# Patient Record
Sex: Female | Born: 1980 | Race: Black or African American | Hispanic: No | Smoking: Never smoker
Health system: Southern US, Community
[De-identification: ages and names within clinical notes are randomized; demographics above are authoritative.]

## PROBLEM LIST (undated history)

## (undated) DIAGNOSIS — Z789 Other specified health status: Secondary | ICD-10-CM

## (undated) HISTORY — PX: NO PAST SURGERIES: SHX2092

---

## 2014-03-24 ENCOUNTER — Encounter (HOSPITAL_COMMUNITY): Payer: Self-pay | Admitting: *Deleted

## 2014-03-24 ENCOUNTER — Inpatient Hospital Stay (HOSPITAL_COMMUNITY)
Admission: AD | Admit: 2014-03-24 | Discharge: 2014-03-24 | Disposition: A | Discharge status: Home / Self Care | Payer: Self-pay | Source: Ambulatory Visit | Attending: Obstetrics & Gynecology | Admitting: Obstetrics & Gynecology

## 2014-03-24 ENCOUNTER — Inpatient Hospital Stay (HOSPITAL_COMMUNITY): Payer: Self-pay

## 2014-03-24 DIAGNOSIS — R109 Unspecified abdominal pain: Secondary | ICD-10-CM | POA: Insufficient documentation

## 2014-03-24 DIAGNOSIS — O200 Threatened abortion: Secondary | ICD-10-CM | POA: Insufficient documentation

## 2014-03-24 HISTORY — DX: Other specified health status: Z78.9

## 2014-03-24 LAB — URINALYSIS, ROUTINE W REFLEX MICROSCOPIC
Bilirubin Urine: NEGATIVE
Glucose, UA: NEGATIVE mg/dL
Hgb urine dipstick: NEGATIVE
Ketones, ur: NEGATIVE mg/dL
LEUKOCYTES UA: NEGATIVE
Nitrite: NEGATIVE
PH: 5.5 (ref 5.0–8.0)
Protein, ur: NEGATIVE mg/dL
Specific Gravity, Urine: >1.03 — ABNORMAL HIGH (ref 1.005–1.030)
Urobilinogen, UA: 0.2 mg/dL (ref 0.0–1.0)

## 2014-03-24 LAB — CBC
HCT: 27.7 % — ABNORMAL LOW (ref 36.0–46.0)
Hemoglobin: 9.4 g/dL — ABNORMAL LOW (ref 12.0–15.0)
MCH: 28.2 pg (ref 26.0–34.0)
MCHC: 33.9 g/dL (ref 30.0–36.0)
MCV: 83.2 fL (ref 78.0–100.0)
PLATELETS: 188 10*3/uL (ref 150–400)
RBC: 3.33 MIL/uL — ABNORMAL LOW (ref 3.87–5.11)
RDW: 14.3 % (ref 11.5–15.5)
WBC: 8.7 10*3/uL (ref 4.0–10.5)

## 2014-03-24 LAB — ABO/RH: ABO/RH(D): O POS

## 2014-03-24 LAB — HCG, QUANTITATIVE, PREGNANCY: HCG, BETA CHAIN, QUANT, S: 1181 m[IU]/mL — AB (ref <5)

## 2014-03-24 LAB — POCT PREGNANCY, URINE: PREG TEST UR: POSITIVE — AB

## 2014-03-24 MED ORDER — HYDROMORPHONE HCL PF 1 MG/ML IJ SOLN
1.0000 mg | Freq: Once | INTRAMUSCULAR | Status: AC
  Administered 2014-03-24: 1 mg via INTRAVENOUS
  Filled 2014-03-24: qty 1

## 2014-03-24 MED ORDER — MISOPROSTOL 200 MCG PO TABS: ORAL_TABLET | ORAL | Status: AC

## 2014-03-24 MED ORDER — TRAMADOL HCL 50 MG PO TABS: 50.0000 mg | ORAL_TABLET | Freq: Four times a day (QID) | ORAL | Status: AC | PRN

## 2014-03-24 NOTE — Discharge Instructions (Signed)
Miscarriage °A miscarriage is the loss of an unborn baby (fetus) before the 20th week of pregnancy. The cause is often unknown.  °HOME CARE °· You may need to stay in bed (bed rest), or you may be able to do light activity. Go about activity as told by your doctor. °· Have help at home. °· Write down how many pads you use each day. Write down how soaked they are. °· Do not use tampons. Do not wash out your vagina (douche) or have sex (intercourse) until your doctor approves. °· Only take medicine as told by your doctor. °· Do not take aspirin. °· Keep all doctor visits as told. °· If you or your partner have problems with grieving, talk to your doctor. You can also try counseling. Give yourself time to grieve before trying to get pregnant again. °GET HELP RIGHT AWAY IF: °· You have bad cramps or pain in your back or belly (abdomen). °· You have a fever. °· You pass large clumps of blood (clots) from your vagina that are walnut-sized or larger. Save the clumps for your doctor to see. °· You pass large amounts of tissue from your vagina. Save the tissue for your doctor to see. °· You have more bleeding. °· You have thick, bad-smelling fluid (discharge) coming from the vagina. °· You get lightheaded, weak, or you pass out (faint). °· You have chills. °MAKE SURE YOU: °· Understand these instructions. °· Will watch your condition. °· Will get help right away if you are not doing well or get worse. °Document Released: 12/05/2011 Document Reviewed: 12/05/2011 °Exit[employer]® Patient Information ©2015 [employer]. This information is not intended to replace advice given to you by your health care provider. Make sure you discuss any questions you have with your health care provider. ° °

## 2014-03-24 NOTE — MAU Provider Note (Signed)
First Provider Initiated Contact with Patient 03/24/14 1455      Chief Complaint:  Abdominal Pain and Vaginal Bleeding   Melinda Sandoval is  33 y.o. [id number] at Unknown gestation presents complaining of Abdominal Pain and Vaginal Bleeding She has not had any PNC this pregnancy. She was at the [employer] with her family and started bleeding heavily and EMS was called. Is cramping heavily. She lives in [place], and is here visiting.   Obstetrical/Gynecological History: OB History   Grav Para Term Preterm Abortions TAB SAB Ect Mult Living   5 3   2  2   3      Past Medical History: Past Medical History  Diagnosis Date  . Medical history non-contributory     Past Surgical History: Past Surgical History  Procedure Laterality Date  . No past surgeries      Family History: History reviewed. No pertinent family history.  Social History: History  Substance Use Topics  . Smoking status: Never Smoker   . Smokeless tobacco: Never Used  . Alcohol Use: No    Allergies: No Known Allergies  Meds:  No prescriptions prior to admission    Review of Systems   Constitutional: Negative for fever and chills Eyes: Negative for visual disturbances Respiratory: Negative for shortness of breath, dyspnea Cardiovascular: Negative for chest pain or palpitations  Gastrointestinal: Negative for vomiting, diarrhea and constipation Genitourinary: Negative for dysuria and urgency Musculoskeletal: Negative for back pain, joint pain, myalgias  Neurological: Negative for dizziness and headaches    Physical Exam  Blood pressure 93/54, pulse 102, temperature 98.4 F (36.9 C), temperature source Oral, resp. rate 16, height 5\' 8"  (1.727 m), weight 74.39 kg (164 lb), last menstrual period 12/27/2013, SpO2 100.00%. GENERAL: Well-developed, well-nourished female in no acute distress.  LUNGS: Clear to auscultation bilaterally.  HEART: Regular rate and rhythm. ABDOMEN: Soft, nontender, nondistended,  gravid.  EXTREMITIES: Nontender, no edema, 2+ distal pulses. DTR's 2+ CERVICAL EXAM: Large amount of bright red vaginal bleeding.   Labs: Results for orders placed during the hospital encounter of 03/24/14 (from the past 24 hour(s))  URINALYSIS, ROUTINE W REFLEX MICROSCOPIC   Collection Time    03/24/14  2:27 PM      Result Value Ref Range   Color, Urine YELLOW  YELLOW   APPearance CLEAR  CLEAR   Specific Gravity, Urine >1.030 (*) 1.005 - 1.030   pH 5.5  5.0 - 8.0   Glucose, UA NEGATIVE  NEGATIVE mg/dL   Hgb urine dipstick NEGATIVE  NEGATIVE   Bilirubin Urine NEGATIVE  NEGATIVE   Ketones, ur NEGATIVE  NEGATIVE mg/dL   Protein, ur NEGATIVE  NEGATIVE mg/dL   Urobilinogen, UA 0.2  0.0 - 1.0 mg/dL   Nitrite NEGATIVE  NEGATIVE   Leukocytes, UA NEGATIVE  NEGATIVE  POCT PREGNANCY, URINE   Collection Time    03/24/14  2:35 PM      Result Value Ref Range   Preg Test, Ur POSITIVE (*) NEGATIVE  CBC   Collection Time    03/24/14  2:40 PM      Result Value Ref Range   WBC 8.7  4.0 - 10.5 K/uL   RBC 3.33 (*) 3.87 - 5.11 MIL/uL   Hemoglobin 9.4 (*) 12.0 - 15.0 g/dL   HCT 30.827.7 (*) 65.736.0 - 84.646.0 %   MCV 83.2  78.0 - 100.0 fL   MCH 28.2  26.0 - 34.0 pg   MCHC 33.9  30.0 - 36.0 g/dL   RDW  14.3  11.5 - 15.5 %   Platelets 188  150 - 400 K/uL  HCG, QUANTITATIVE, PREGNANCY   Collection Time    03/24/14  2:51 PM      Result Value Ref Range   hCG, Beta Chain, Quant, S 1181 (*) <5 mIU/mL  ABO/RH   Collection Time    03/24/14  2:51 PM      Result Value Ref Range   ABO/RH(D) O POS     Imaging Studies:  US Ob Comp Less 14 Wks  03/24/2014   CLINICAL DATA:  33 year old G6 P3 AB2, LMP 12/27/2013 (12 weeks 3 days), quantitative beta HCG 1,181, presenting with heavy vaginal bleeding.  EXAM: OBSTETRIC <14 WK [place] AND TRANSVAGINAL OB US  TECHNIQUE: Both transabdominal and transvaginal ultrasound examinations were performed for complete evaluation of the gestation as well as the maternal uterus, adnexal  regions, and pelvic cul-de-sac. Transvaginal technique was performed to assess early pregnancy.  COMPARISON:  None.  FINDINGS: Intrauterine gestational sac: Identified in the lower uterine segment and in the uterine cervix.  Yolk sac:  Not identified.  Embryo:  Not identified.  Cardiac Activity: Not applicable.  MSD:  36.1 mm   9 w   1 d  [place] EDC: Not applicable.  Maternal uterus/adnexae: Normal-appearing ovaries bilaterally, the left ovary measuring approximately 4.1 x 2.0 x 2.5 cm and the right ovary measuring approximately 2.3 x 1.7 x 1.8 cm. No adnexal masses or free pelvic fluid.  IMPRESSION: 1. Intrauterine gestational sac in the lower uterine segment and in the uterine cervix, consistent with a spontaneous abortion in progress or impending spontaneous abortion. 2. Absent embryo despite a mean sac diameter of 36.1 mm. Findings meet definitive criteria for failed pregnancy. This follows SRU consensus guidelines: Diagnostic Criteria for Nonviable Pregnancy Early in the First Trimester. Macy Mis J Med [id number]. 3. Normal-appearing ovaries. No adnexal masses or free pelvic fluid. These results were called by telephone at the time of interpretation on 03/24/2014 at 4:28 PM to Scarlet in the [facility]U, who verbally acknowledged these results.   Electronically Signed   By: Hulan Saas M.D.   On: 03/24/2014 16:29   US Ob Transvaginal  03/24/2014   CLINICAL DATA:  33 year old G6 P3 AB2, LMP 12/27/2013 (12 weeks 3 days), quantitative beta HCG 1,181, presenting with heavy vaginal bleeding.  EXAM: OBSTETRIC <14 WK [place] AND TRANSVAGINAL OB US  TECHNIQUE: Both transabdominal and transvaginal ultrasound examinations were performed for complete evaluation of the gestation as well as the maternal uterus, adnexal regions, and pelvic cul-de-sac. Transvaginal technique was performed to assess early pregnancy.  COMPARISON:  None.  FINDINGS: Intrauterine gestational sac: Identified in the lower uterine segment and in the  uterine cervix.  Yolk sac:  Not identified.  Embryo:  Not identified.  Cardiac Activity: Not applicable.  MSD:  36.1 mm   9 w   1 d  [place] EDC: Not applicable.  Maternal uterus/adnexae: Normal-appearing ovaries bilaterally, the left ovary measuring approximately 4.1 x 2.0 x 2.5 cm and the right ovary measuring approximately 2.3 x 1.7 x 1.8 cm. No adnexal masses or free pelvic fluid.  IMPRESSION: 1. Intrauterine gestational sac in the lower uterine segment and in the uterine cervix, consistent with a spontaneous abortion in progress or impending spontaneous abortion. 2. Absent embryo despite a mean sac diameter of 36.1 mm. Findings meet definitive criteria for failed pregnancy. This follows SRU consensus guidelines: Diagnostic Criteria for Nonviable Pregnancy Early in the First Trimester. N Engl J Med  2013;369:1443-51. 3. Normal-appearing ovaries. No adnexal masses or free pelvic fluid. These results were called by telephone at the time of interpretation on 03/24/2014 at 4:28 PM to Scarlet in the MAU, who verbally acknowledged these results.   Electronically Signed   By: Hulan Saashomas  Lawrence M.D.   On: 03/24/2014 16:29    Assessment: Melinda Sandoval is  33 y.o. [id number] at Unknown presents with inevitable abortion. Discussed with Dr. Debroah LoopArnold.   Plan: Meds ordered this encounter  Medications  . HYDROmorphone (DILAUDID) injection 1 mg    Sig:   . misoprostol (CYTOTEC) 200 MCG tablet    Sig: Take both tablets by mouth    Dispense:  2 tablet    Refill:  0    Order Specific Question:  Supervising Provider    Answer:  Adam PhenixARNOLD, JAMES G [[id number]]  . traMADol (ULTRAM) 50 MG tablet    Sig: Take 1 tablet (50 mg total) by mouth every 6 (six) hours as needed.    Dispense:  20 tablet    Refill:  0    Order Specific Question:  Supervising Provider    Answer:  Adam PhenixARNOLD, JAMES G [[id number]]   Copy of records from this visit sent with pt to take to her OB/GYN in [place].    CRESENZO-DISHMAN,Karanveer Ramakrishnan 6/29/20155:19 PM

## 2014-03-24 NOTE — MAU Note (Addendum)
Patient presents via EMS. Melinda Sandoval, EMS personnel, [place] that [facility] [place] [employer] [employer] [place] she started bleeding heavily and was transported to [facility]. Arrived with an IV of 500ml NS infusing via 20g IVC in pt's right forearm.

## 2014-07-29 ENCOUNTER — Encounter (HOSPITAL_COMMUNITY): Payer: Self-pay | Admitting: *Deleted

## 2015-11-23 IMAGING — US US OB TRANSVAGINAL
1 series · 13 of 28 positions shown · non-contrast
Comparison: None.

CLINICAL DATA: 32-year-old G6 P3 AB2, LMP 12/27/2013 ([DATE] weeks 3
days), quantitative beta HCG [DATE], presenting with heavy vaginal
bleeding.

EXAM:
OBSTETRIC <14 WK US AND TRANSVAGINAL OB US
TECHNIQUE: Both transabdominal and transvaginal ultrasound examinations were
performed for complete evaluation of the gestation as well as the
maternal uterus, adnexal regions, and pelvic cul-de-sac.
Transvaginal technique was performed to assess early pregnancy.

[Series 1: us ob comp less 14 wks · 13 of 73 slices shown]
[im 3/73]
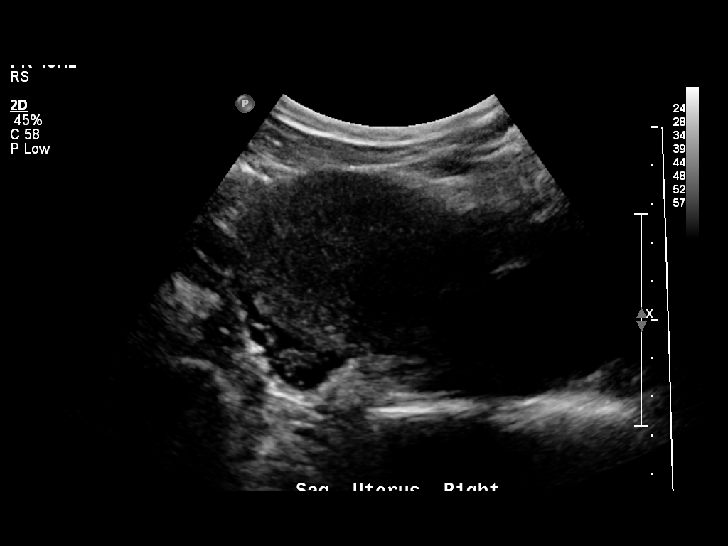
[im 9/73]
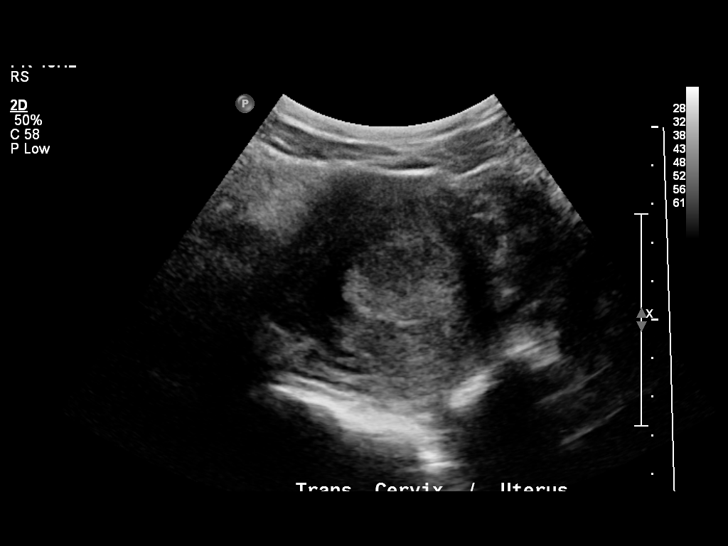
[im 14/73]
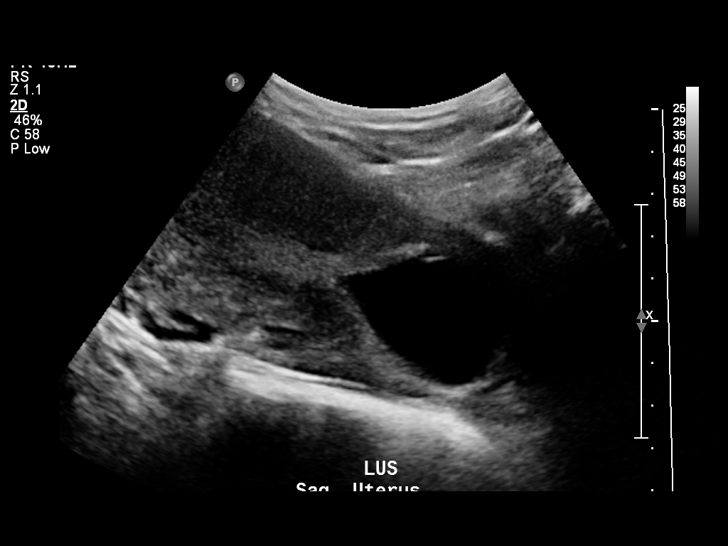
[im 19/73]
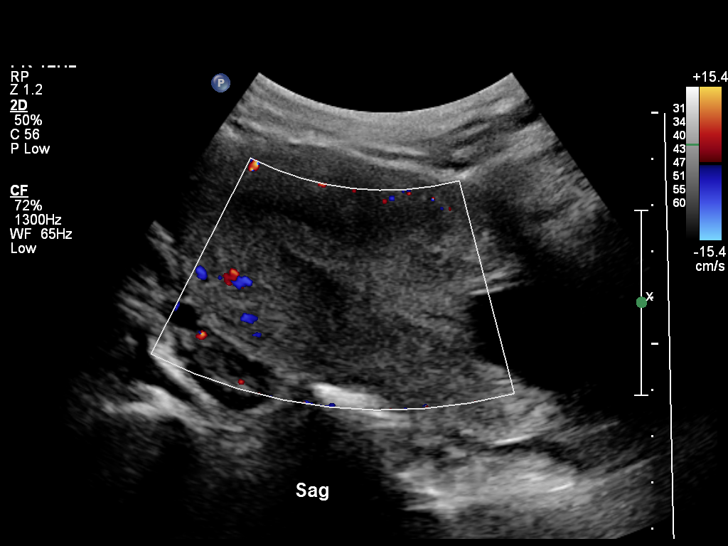
[im 25/73]
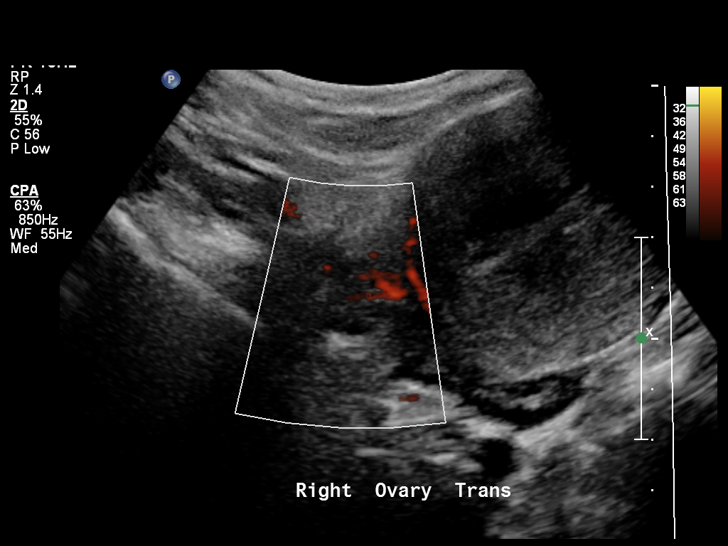
[im 30/73]
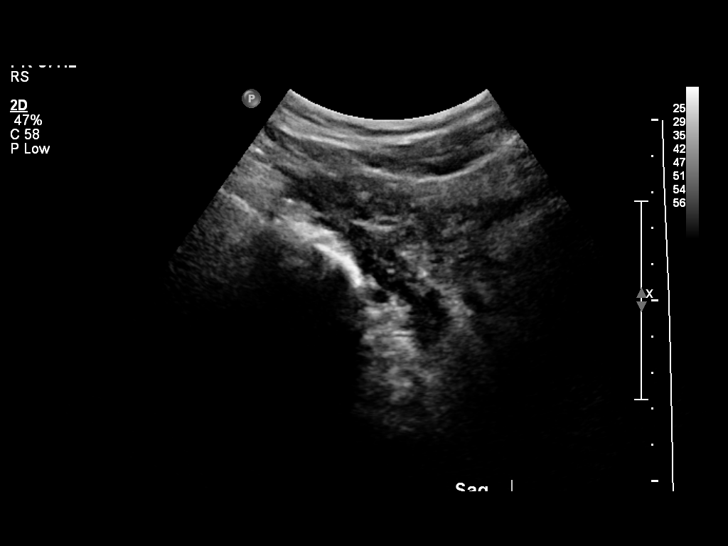
[im 38/73]
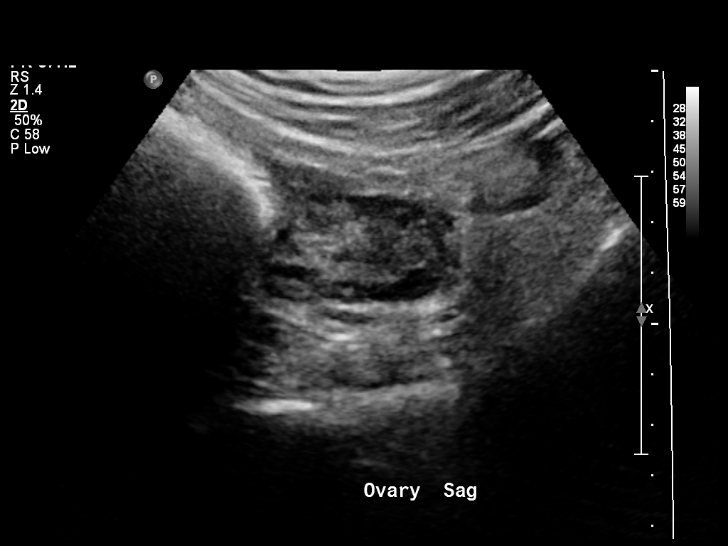
[im 43/73]
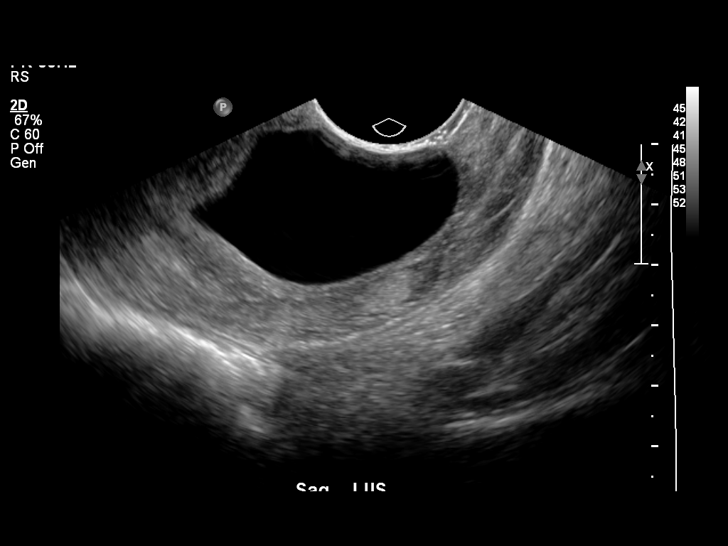
[im 49/73]
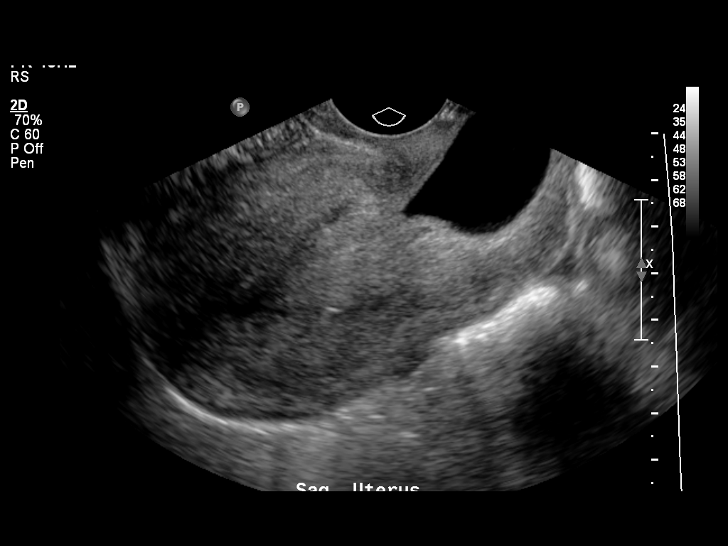
[im 54/73]
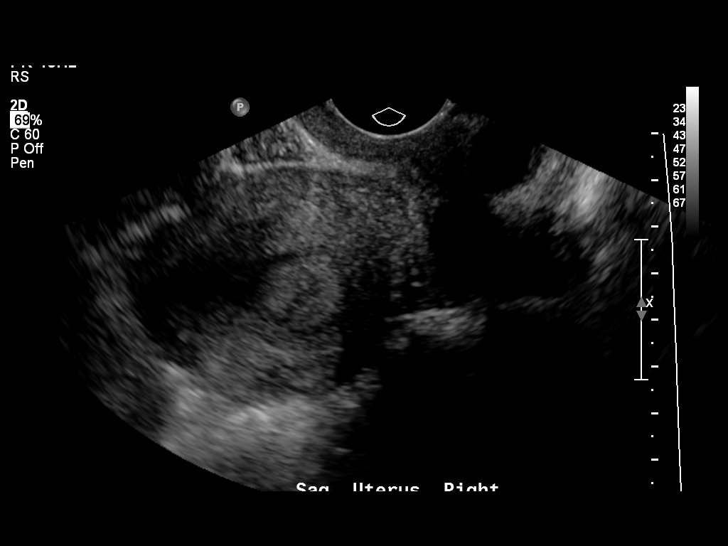
[im 59/73]
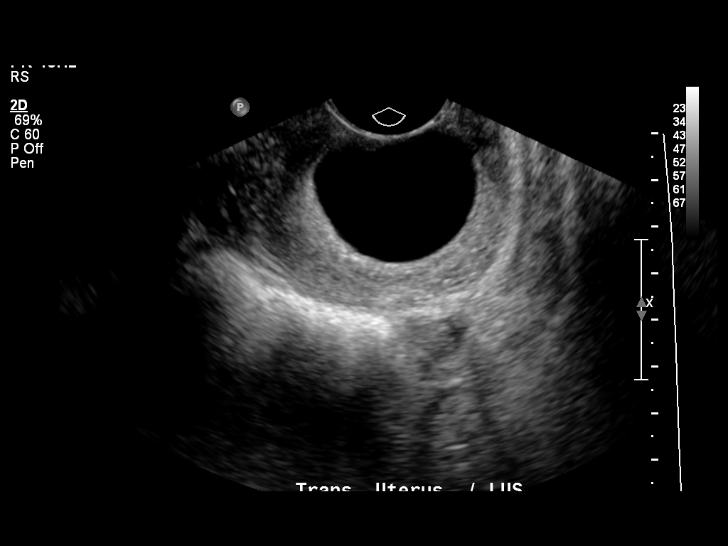
[im 65/73]
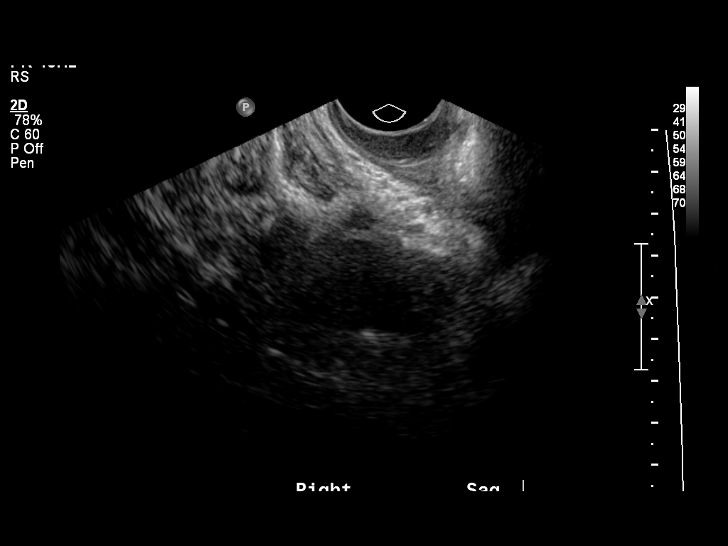
[im 70/73]
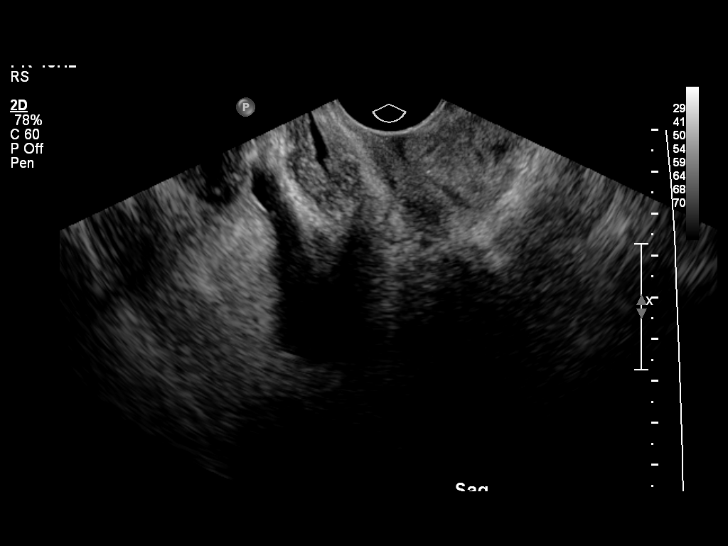

[13 of 28 positions shown; findings below may reference images not displayed]

FINDINGS: Intrauterine gestational sac: Identified in the lower uterine
segment and in the uterine cervix.

Yolk sac:  Not identified.

Embryo:  Not identified.

Cardiac Activity: Not applicable.

MSD:  36.1 mm   9 w   1 d

US EDC: Not applicable.

Maternal uterus/adnexae: Normal-appearing ovaries bilaterally, the
left ovary measuring approximately 4.1 x 2.0 x 2.5 cm and the right
ovary measuring approximately 2.3 x 1.7 x 1.8 cm. No adnexal masses
or free pelvic fluid.
IMPRESSION: 1. Intrauterine gestational sac in the lower uterine segment and in
the uterine cervix, consistent with a spontaneous abortion in
progress or impending spontaneous abortion.
2. Absent embryo despite a mean sac diameter of 36.1 mm. Findings
meet definitive criteria for failed pregnancy. This follows SRU
consensus guidelines: Diagnostic Criteria for Nonviable Pregnancy
Early in the First Trimester. N Engl J Med 3344;[DATE].
3. Normal-appearing ovaries. No adnexal masses or free pelvic fluid.
These results were called by telephone at the time of interpretation
on 03/24/2014 at [DATE] to [place] in the [facility], who verbally
acknowledged these results.
